# Patient Record
Sex: Male | Born: 1980 | Race: White | Hispanic: No | Marital: Single | Smoking: Never smoker
Health system: Southern US, Community
[De-identification: ages and names within clinical notes are randomized; demographics above are authoritative.]

---

## 2006-06-23 ENCOUNTER — Emergency Department (HOSPITAL_COMMUNITY): Admission: EM | Admit: 2006-06-23 | Discharge: 2006-06-23 | Payer: Self-pay | Admitting: Family Medicine

## 2007-07-11 ENCOUNTER — Emergency Department (HOSPITAL_COMMUNITY): Admission: EM | Admit: 2007-07-11 | Discharge: 2007-07-11 | Payer: Self-pay | Admitting: Family Medicine

## 2008-08-12 IMAGING — CR DG CHEST 2V
2 series · 2 of 2 positions shown · non-contrast
Comparison: None.

CLINICAL DATA: Chest pain following a fall from a height.

CHEST - 2 VIEW

[view not recorded (1 of 2)]
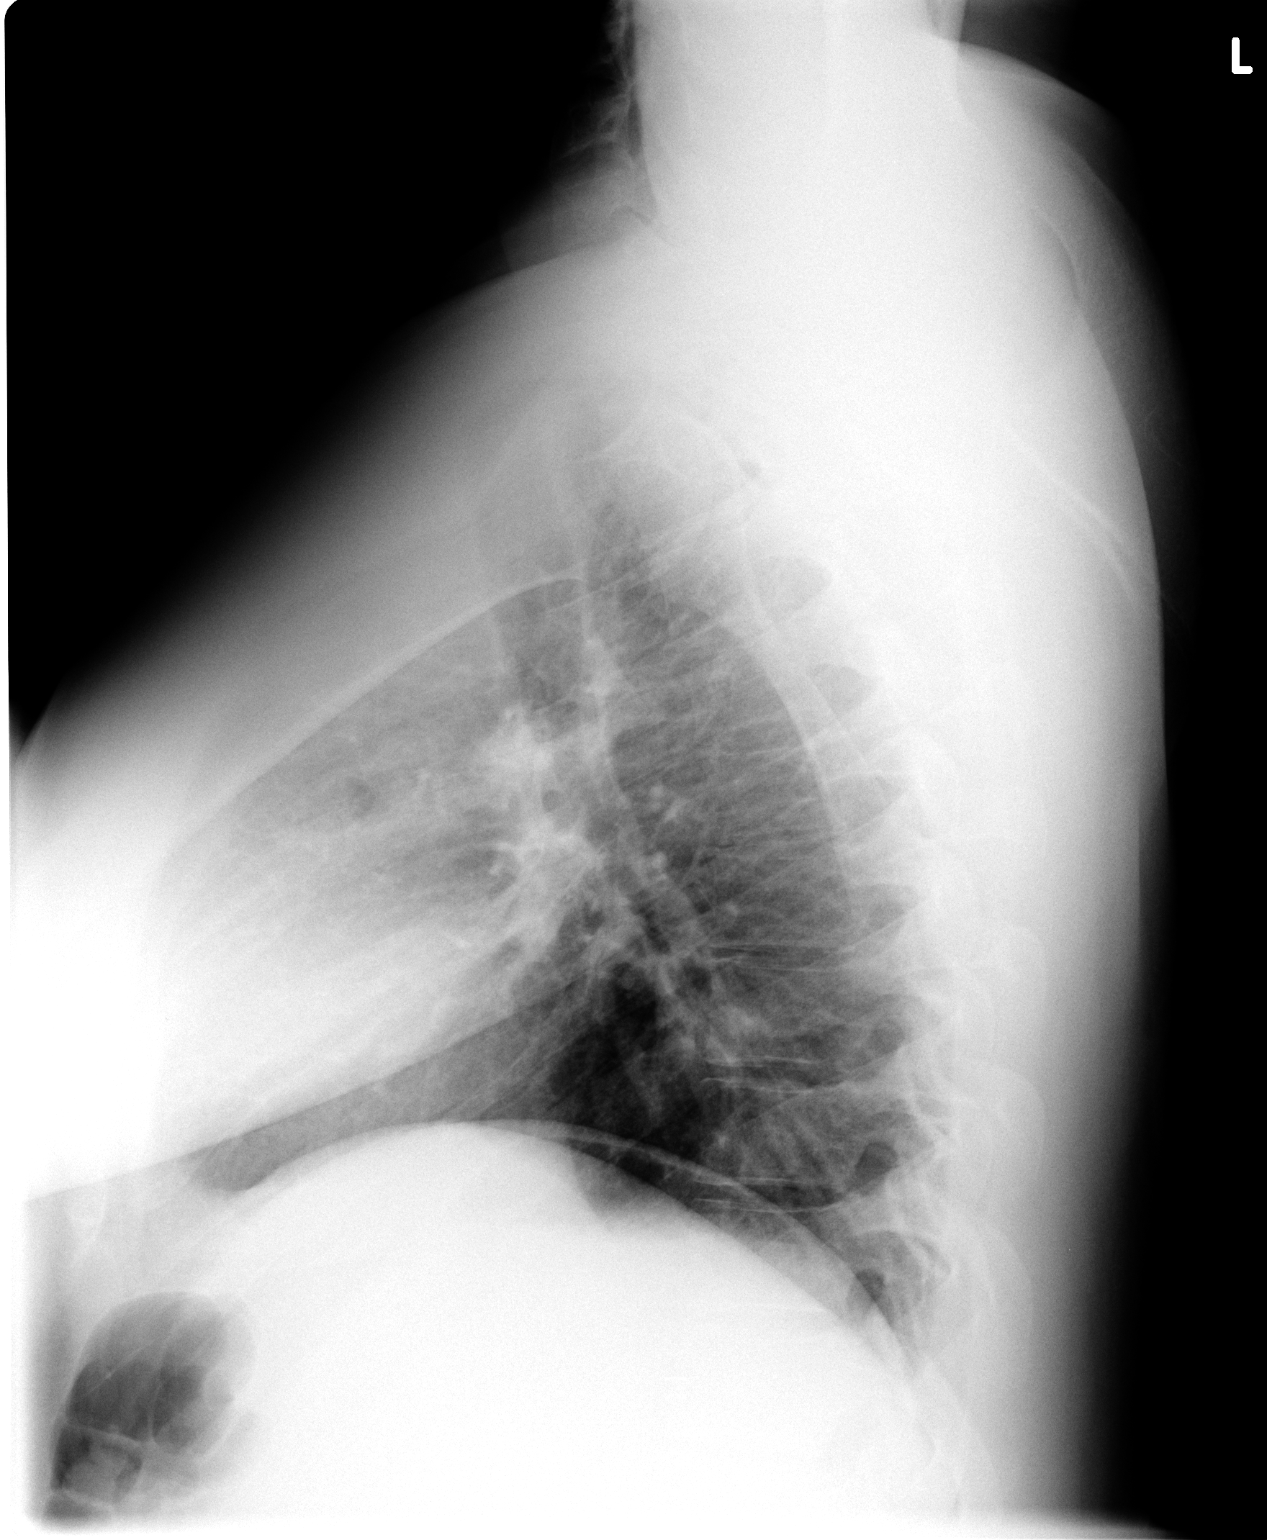

[view not recorded (2 of 2)]
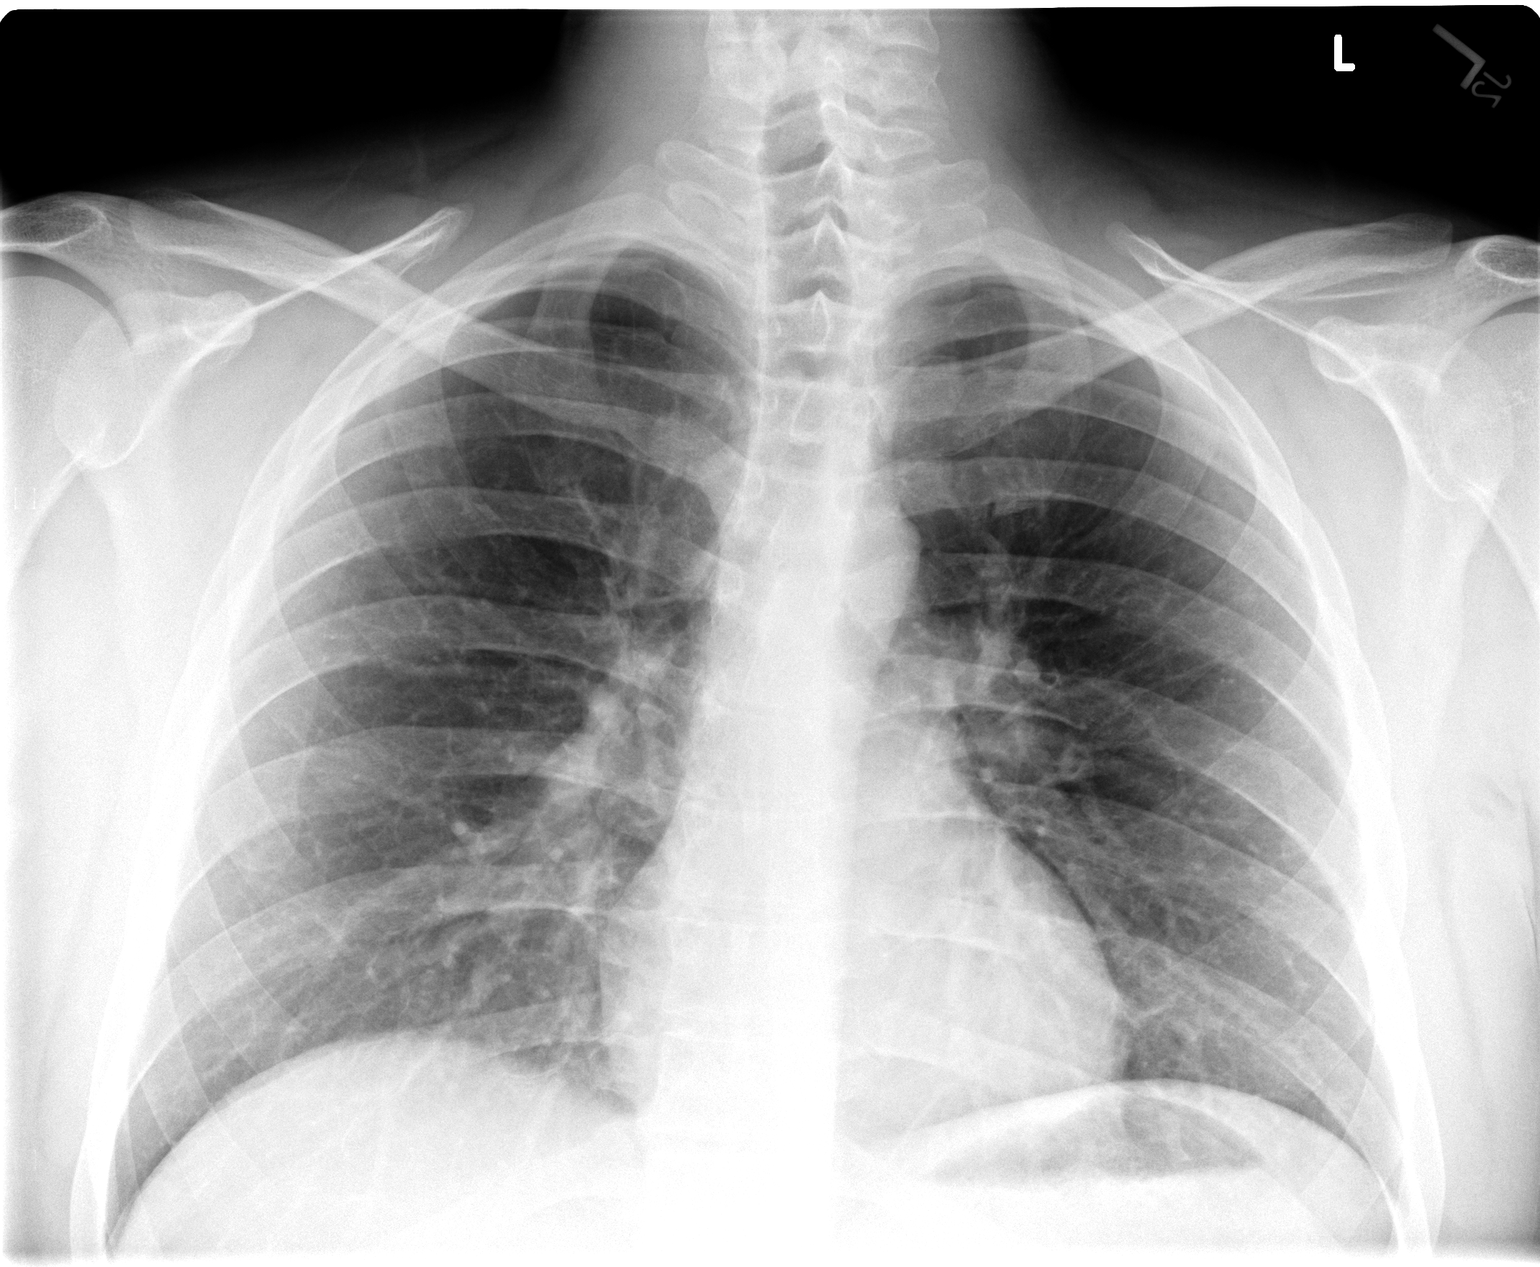

[2 of 2 positions shown; findings below may reference images not displayed]

FINDINGS: Normal sized heart. Clear lungs. No fracture pneumothorax seen. The
patient could not raise his left arm out of the field-of-view on the lateral
view.

IMPRESSION

Normal examination.

## 2011-09-23 ENCOUNTER — Other Ambulatory Visit: Payer: Self-pay | Admitting: Sports Medicine

## 2011-09-23 DIAGNOSIS — M25572 Pain in left ankle and joints of left foot: Secondary | ICD-10-CM

## 2011-09-25 ENCOUNTER — Other Ambulatory Visit: Payer: Self-pay

## 2016-09-04 DIAGNOSIS — J040 Acute laryngitis: Secondary | ICD-10-CM | POA: Diagnosis not present

## 2016-11-03 DIAGNOSIS — Z0000 Encounter for general adult medical examination without abnormal findings: Secondary | ICD-10-CM | POA: Diagnosis not present

## 2016-11-03 DIAGNOSIS — E559 Vitamin D deficiency, unspecified: Secondary | ICD-10-CM | POA: Diagnosis not present

## 2016-11-03 DIAGNOSIS — E039 Hypothyroidism, unspecified: Secondary | ICD-10-CM | POA: Diagnosis not present

## 2016-11-03 DIAGNOSIS — E669 Obesity, unspecified: Secondary | ICD-10-CM | POA: Diagnosis not present

## 2017-05-25 DIAGNOSIS — S61217A Laceration without foreign body of left little finger without damage to nail, initial encounter: Secondary | ICD-10-CM | POA: Diagnosis not present

## 2017-06-03 DIAGNOSIS — S61217D Laceration without foreign body of left little finger without damage to nail, subsequent encounter: Secondary | ICD-10-CM | POA: Diagnosis not present

## 2017-12-30 ENCOUNTER — Ambulatory Visit: Payer: BLUE CROSS/BLUE SHIELD | Admitting: Podiatry

## 2017-12-30 ENCOUNTER — Other Ambulatory Visit: Payer: Self-pay | Admitting: Podiatry

## 2017-12-30 ENCOUNTER — Ambulatory Visit (INDEPENDENT_AMBULATORY_CARE_PROVIDER_SITE_OTHER): Payer: BLUE CROSS/BLUE SHIELD

## 2017-12-30 ENCOUNTER — Encounter: Payer: Self-pay | Admitting: Podiatry

## 2017-12-30 VITALS — BP 134/90 | HR 54 | Resp 16

## 2017-12-30 DIAGNOSIS — M722 Plantar fascial fibromatosis: Secondary | ICD-10-CM | POA: Diagnosis not present

## 2017-12-30 MED ORDER — MELOXICAM 15 MG PO TABS: 15.0000 mg | ORAL_TABLET | Freq: Every day | ORAL | 3 refills | Status: DC

## 2017-12-30 MED ORDER — METHYLPREDNISOLONE 4 MG PO TBPK: ORAL_TABLET | ORAL | 0 refills | Status: AC

## 2017-12-30 NOTE — Patient Instructions (Signed)

## 2017-12-30 NOTE — Progress Notes (Signed)
  Subjective:  Patient ID: Brian Bender, male    DOB: August 12, 1981,  MRN: [id number] HPI Chief Complaint  Patient presents with  . Foot Pain    Plantar heel right - tender x 2 years, worsened recently, AM pain, Ibuprofen as needed  . New Patient (Initial Visit)    37 y.o. male presents with the above complaint.   Review of systems: Denies fever chills nausea vomiting muscle aches pains calf pain back pain chest pain shortness of breath and headache.  No past medical history on file.   Current Outpatient Medications:  .  meloxicam (MOBIC) 15 MG tablet, Take 1 tablet (15 mg total) by mouth daily., Disp: 30 tablet, Rfl: 3 .  methylPREDNISolone (MEDROL DOSEPAK) 4 MG TBPK tablet, 6 day dose pack - take as directed, Disp: 21 tablet, Rfl: 0  No Known Allergies Review of Systems Objective:   Vitals:   12/30/17 0851  BP: 134/90  Pulse: (!) 54  Resp: 16    General: Well developed, nourished, in no acute distress, alert and oriented x3   Dermatological: Skin is warm, dry and supple bilateral. Nails x 10 are well maintained; remaining integument appears unremarkable at this time. There are no open sores, no preulcerative lesions, no rash or signs of infection present.  Vascular: Dorsalis Pedis artery and Posterior Tibial artery pedal pulses are 2/4 bilateral with immedate capillary fill time. Pedal hair growth present. No varicosities and no lower extremity edema present bilateral.   Neruologic: Grossly intact via light touch bilateral. Vibratory intact via tuning fork bilateral. Protective threshold with Semmes Wienstein monofilament intact to all pedal sites bilateral. Patellar and Achilles deep tendon reflexes 2+ bilateral. No Babinski or clonus noted bilateral.   Musculoskeletal: No gross boney pedal deformities bilateral. No pain, crepitus, or limitation noted with foot and ankle range of motion bilateral. Muscular strength 5/5 in all groups tested bilateral.  He has pain on palpation  medial calcaneal tubercle at the plantar fascial calcaneal insertion site medial band.  He has no pain on medial and lateral compression of the calcaneus no pain on palpation of the Achilles tendon.  Gait: Unassisted, Nonantalgic.    Radiographs:  Radiographs demonstrate an osseously mature individual retrocalcaneal heel spur no thickening of the Achilles tendon however there is significant thickening of the plantar fascia at his calcaneal insertion site on the right heel.  No acute findings are noted.  Assessment & Plan:   Assessment: Plantar fasciitis chronic in nature right heel.  Plan: Discussed etiology pathology conservative versus surgical therapies.  At this point I injected his right heel today with 20 mg Kenalog 5 mg Marcaine after sterile Betadine skin prep to the point of maximal tenderness of the right heel.  He tolerated procedure well without complications.  Start him on a Medrol Dosepak to be followed by meloxicam.  Placement plantar fascial brace and a night splint.  Discussed appropriate shoe gear stretching exercises ice therapy as your modifications.  I will follow-up with him in 1 month.     [employer], [place]

## 2018-02-03 ENCOUNTER — Ambulatory Visit: Payer: BLUE CROSS/BLUE SHIELD | Admitting: Podiatry

## 2019-09-04 DIAGNOSIS — E669 Obesity, unspecified: Secondary | ICD-10-CM | POA: Diagnosis not present

## 2019-09-04 DIAGNOSIS — E559 Vitamin D deficiency, unspecified: Secondary | ICD-10-CM | POA: Diagnosis not present

## 2019-09-04 DIAGNOSIS — E039 Hypothyroidism, unspecified: Secondary | ICD-10-CM | POA: Diagnosis not present

## 2019-11-09 DIAGNOSIS — E039 Hypothyroidism, unspecified: Secondary | ICD-10-CM | POA: Diagnosis not present

## 2020-11-13 DIAGNOSIS — Z0001 Encounter for general adult medical examination with abnormal findings: Secondary | ICD-10-CM | POA: Diagnosis not present

## 2020-11-13 DIAGNOSIS — Z13220 Encounter for screening for lipoid disorders: Secondary | ICD-10-CM | POA: Diagnosis not present

## 2020-11-13 DIAGNOSIS — E559 Vitamin D deficiency, unspecified: Secondary | ICD-10-CM | POA: Diagnosis not present

## 2020-11-28 ENCOUNTER — Ambulatory Visit: Payer: BLUE CROSS/BLUE SHIELD | Admitting: Podiatrist

## 2020-11-28 ENCOUNTER — Ambulatory Visit: Payer: BC Managed Care – PPO | Admitting: Podiatrist

## 2020-12-04 ENCOUNTER — Ambulatory Visit (INDEPENDENT_AMBULATORY_CARE_PROVIDER_SITE_OTHER): Payer: BC Managed Care – PPO | Admitting: Podiatry

## 2020-12-04 ENCOUNTER — Ambulatory Visit (INDEPENDENT_AMBULATORY_CARE_PROVIDER_SITE_OTHER): Payer: BC Managed Care – PPO

## 2020-12-04 ENCOUNTER — Other Ambulatory Visit: Payer: Self-pay

## 2020-12-04 DIAGNOSIS — M722 Plantar fascial fibromatosis: Secondary | ICD-10-CM | POA: Diagnosis not present

## 2020-12-04 DIAGNOSIS — M79671 Pain in right foot: Secondary | ICD-10-CM | POA: Diagnosis not present

## 2020-12-04 DIAGNOSIS — M79672 Pain in left foot: Secondary | ICD-10-CM

## 2020-12-04 MED ORDER — MELOXICAM 15 MG PO TABS: 15.0000 mg | ORAL_TABLET | Freq: Every day | ORAL | 1 refills | Status: AC

## 2020-12-11 NOTE — Progress Notes (Signed)
   HPI: 40 y.o. male presenting today for evaluation of right heel pain is been going on intermittently for several years now.  Patient states that over the last few months it is affected both of his heels.  He is changed his work boots recently which have helped significantly but he does not have 100% relief.  He presents for further treatment and is evaluation  No past medical history on file.   Physical Exam: General: The patient is alert and oriented x3 in no acute distress.  Dermatology: Skin is warm, dry and supple bilateral lower extremities. Negative for open lesions or macerations.  Vascular: Palpable pedal pulses bilaterally. No edema or erythema noted. Capillary refill within normal limits.  Neurological: Epicritic and protective threshold grossly intact bilaterally.   Musculoskeletal Exam: Range of motion within normal limits to all pedal and ankle joints bilateral. Muscle strength 5/5 in all groups bilateral.  There is very slight tenderness to palpation along the plantar fascia as it inserts onto the medial calcaneal tubercle bilateral  Radiographic Exam:  Normal osseous mineralization. Joint spaces preserved. No fracture/dislocation/boney destruction.    Assessment: 1.  Plantar fasciitis bilateral; mostly asymptomatic after changing work boots   Plan of Care:  1. Patient evaluated. X-Rays reviewed.  2.  At the moment the patient is getting much better with changing his work boots.  No injections administered today 3.  Prescription for meloxicam 15 mg daily as needed 4.  Continue wearing good supportive work boots that do not aggravate the heel 5.  Return to clinic as needed      Felecia Shelling, DPM [facility]  Dr. Felecia Shelling, DPM    [address] [place], [place] [address]                Office [phone]  Fax [phone]

## 2020-12-19 ENCOUNTER — Ambulatory Visit: Payer: BLUE CROSS/BLUE SHIELD | Admitting: Podiatry

## 2021-08-01 DIAGNOSIS — J069 Acute upper respiratory infection, unspecified: Secondary | ICD-10-CM | POA: Diagnosis not present

## 2021-08-01 DIAGNOSIS — Z20822 Contact with and (suspected) exposure to covid-19: Secondary | ICD-10-CM | POA: Diagnosis not present

## 2021-09-15 DIAGNOSIS — M25532 Pain in left wrist: Secondary | ICD-10-CM | POA: Diagnosis not present

## 2021-12-29 DIAGNOSIS — E039 Hypothyroidism, unspecified: Secondary | ICD-10-CM | POA: Diagnosis not present

## 2022-09-09 DIAGNOSIS — M5136 Other intervertebral disc degeneration, lumbar region: Secondary | ICD-10-CM | POA: Diagnosis not present

## 2022-09-09 DIAGNOSIS — M9903 Segmental and somatic dysfunction of lumbar region: Secondary | ICD-10-CM | POA: Diagnosis not present

## 2022-09-15 DIAGNOSIS — M5136 Other intervertebral disc degeneration, lumbar region: Secondary | ICD-10-CM | POA: Diagnosis not present

## 2022-09-15 DIAGNOSIS — M9903 Segmental and somatic dysfunction of lumbar region: Secondary | ICD-10-CM | POA: Diagnosis not present

## 2022-09-17 DIAGNOSIS — M9903 Segmental and somatic dysfunction of lumbar region: Secondary | ICD-10-CM | POA: Diagnosis not present

## 2022-09-17 DIAGNOSIS — M5136 Other intervertebral disc degeneration, lumbar region: Secondary | ICD-10-CM | POA: Diagnosis not present

## 2022-09-22 DIAGNOSIS — M9903 Segmental and somatic dysfunction of lumbar region: Secondary | ICD-10-CM | POA: Diagnosis not present

## 2022-09-22 DIAGNOSIS — M5136 Other intervertebral disc degeneration, lumbar region: Secondary | ICD-10-CM | POA: Diagnosis not present

## 2022-09-24 DIAGNOSIS — M9903 Segmental and somatic dysfunction of lumbar region: Secondary | ICD-10-CM | POA: Diagnosis not present

## 2022-09-24 DIAGNOSIS — M5136 Other intervertebral disc degeneration, lumbar region: Secondary | ICD-10-CM | POA: Diagnosis not present

## 2022-09-28 DIAGNOSIS — M9903 Segmental and somatic dysfunction of lumbar region: Secondary | ICD-10-CM | POA: Diagnosis not present

## 2022-09-28 DIAGNOSIS — M5136 Other intervertebral disc degeneration, lumbar region: Secondary | ICD-10-CM | POA: Diagnosis not present

## 2022-09-30 DIAGNOSIS — M5136 Other intervertebral disc degeneration, lumbar region: Secondary | ICD-10-CM | POA: Diagnosis not present

## 2022-09-30 DIAGNOSIS — M9903 Segmental and somatic dysfunction of lumbar region: Secondary | ICD-10-CM | POA: Diagnosis not present

## 2022-10-06 DIAGNOSIS — M5136 Other intervertebral disc degeneration, lumbar region: Secondary | ICD-10-CM | POA: Diagnosis not present

## 2022-10-06 DIAGNOSIS — M9903 Segmental and somatic dysfunction of lumbar region: Secondary | ICD-10-CM | POA: Diagnosis not present

## 2022-10-12 DIAGNOSIS — M9903 Segmental and somatic dysfunction of lumbar region: Secondary | ICD-10-CM | POA: Diagnosis not present

## 2022-10-12 DIAGNOSIS — M5136 Other intervertebral disc degeneration, lumbar region: Secondary | ICD-10-CM | POA: Diagnosis not present

## 2022-10-14 DIAGNOSIS — M5136 Other intervertebral disc degeneration, lumbar region: Secondary | ICD-10-CM | POA: Diagnosis not present

## 2022-10-14 DIAGNOSIS — M9903 Segmental and somatic dysfunction of lumbar region: Secondary | ICD-10-CM | POA: Diagnosis not present

## 2022-10-19 DIAGNOSIS — M9903 Segmental and somatic dysfunction of lumbar region: Secondary | ICD-10-CM | POA: Diagnosis not present

## 2022-10-19 DIAGNOSIS — M5136 Other intervertebral disc degeneration, lumbar region: Secondary | ICD-10-CM | POA: Diagnosis not present

## 2022-10-21 DIAGNOSIS — M5136 Other intervertebral disc degeneration, lumbar region: Secondary | ICD-10-CM | POA: Diagnosis not present

## 2022-10-21 DIAGNOSIS — M9903 Segmental and somatic dysfunction of lumbar region: Secondary | ICD-10-CM | POA: Diagnosis not present

## 2022-10-27 DIAGNOSIS — M9903 Segmental and somatic dysfunction of lumbar region: Secondary | ICD-10-CM | POA: Diagnosis not present

## 2022-10-27 DIAGNOSIS — M5136 Other intervertebral disc degeneration, lumbar region: Secondary | ICD-10-CM | POA: Diagnosis not present

## 2022-11-04 DIAGNOSIS — M9903 Segmental and somatic dysfunction of lumbar region: Secondary | ICD-10-CM | POA: Diagnosis not present

## 2022-11-04 DIAGNOSIS — M5136 Other intervertebral disc degeneration, lumbar region: Secondary | ICD-10-CM | POA: Diagnosis not present

## 2022-11-10 DIAGNOSIS — M9903 Segmental and somatic dysfunction of lumbar region: Secondary | ICD-10-CM | POA: Diagnosis not present

## 2022-11-10 DIAGNOSIS — M5136 Other intervertebral disc degeneration, lumbar region: Secondary | ICD-10-CM | POA: Diagnosis not present

## 2022-11-23 DIAGNOSIS — M9903 Segmental and somatic dysfunction of lumbar region: Secondary | ICD-10-CM | POA: Diagnosis not present

## 2022-11-23 DIAGNOSIS — M5136 Other intervertebral disc degeneration, lumbar region: Secondary | ICD-10-CM | POA: Diagnosis not present

## 2023-01-11 DIAGNOSIS — M9903 Segmental and somatic dysfunction of lumbar region: Secondary | ICD-10-CM | POA: Diagnosis not present

## 2023-01-11 DIAGNOSIS — M5136 Other intervertebral disc degeneration, lumbar region: Secondary | ICD-10-CM | POA: Diagnosis not present

## 2023-01-18 DIAGNOSIS — M9903 Segmental and somatic dysfunction of lumbar region: Secondary | ICD-10-CM | POA: Diagnosis not present

## 2023-01-18 DIAGNOSIS — M5136 Other intervertebral disc degeneration, lumbar region: Secondary | ICD-10-CM | POA: Diagnosis not present

## 2023-01-28 DIAGNOSIS — M9903 Segmental and somatic dysfunction of lumbar region: Secondary | ICD-10-CM | POA: Diagnosis not present

## 2023-01-28 DIAGNOSIS — M5136 Other intervertebral disc degeneration, lumbar region: Secondary | ICD-10-CM | POA: Diagnosis not present

## 2023-02-09 DIAGNOSIS — M9903 Segmental and somatic dysfunction of lumbar region: Secondary | ICD-10-CM | POA: Diagnosis not present

## 2023-02-09 DIAGNOSIS — M5136 Other intervertebral disc degeneration, lumbar region: Secondary | ICD-10-CM | POA: Diagnosis not present

## 2023-03-11 DIAGNOSIS — M9903 Segmental and somatic dysfunction of lumbar region: Secondary | ICD-10-CM | POA: Diagnosis not present

## 2023-03-11 DIAGNOSIS — M5136 Other intervertebral disc degeneration, lumbar region: Secondary | ICD-10-CM | POA: Diagnosis not present

## 2023-03-12 DIAGNOSIS — M7041 Prepatellar bursitis, right knee: Secondary | ICD-10-CM | POA: Diagnosis not present

## 2023-03-24 DIAGNOSIS — E7800 Pure hypercholesterolemia, unspecified: Secondary | ICD-10-CM | POA: Diagnosis not present

## 2023-03-24 DIAGNOSIS — Z79899 Other long term (current) drug therapy: Secondary | ICD-10-CM | POA: Diagnosis not present

## 2023-03-24 DIAGNOSIS — E039 Hypothyroidism, unspecified: Secondary | ICD-10-CM | POA: Diagnosis not present

## 2023-03-24 DIAGNOSIS — Z0001 Encounter for general adult medical examination with abnormal findings: Secondary | ICD-10-CM | POA: Diagnosis not present

## 2024-06-08 DIAGNOSIS — M549 Dorsalgia, unspecified: Secondary | ICD-10-CM | POA: Diagnosis not present
